# Patient Record
Sex: Male | Born: 1955 | Race: White | Hispanic: No | Marital: Married | Smoking: Never smoker
Health system: Southern US, Community
[De-identification: ages and names within clinical notes are randomized; demographics above are authoritative.]

---

## 2012-01-08 ENCOUNTER — Ambulatory Visit: Payer: Self-pay

## 2012-01-08 LAB — URINALYSIS, COMPLETE
Bilirubin,UR: NEGATIVE
Blood: NEGATIVE
Ketone: NEGATIVE
Leukocyte Esterase: NEGATIVE
Ph: 7.5 (ref 4.5–8.0)
RBC,UR: NONE SEEN /HPF (ref 0–5)
Specific Gravity: 1.005 (ref 1.003–1.030)

## 2015-12-02 ENCOUNTER — Emergency Department
Admission: EM | Admit: 2015-12-02 | Discharge: 2015-12-03 | Disposition: A | Discharge status: Other Acute Inpatient Hospital | Payer: No Typology Code available for payment source | Attending: Emergency Medicine | Admitting: Emergency Medicine

## 2015-12-02 ENCOUNTER — Emergency Department: Payer: No Typology Code available for payment source

## 2015-12-02 ENCOUNTER — Ambulatory Visit (INDEPENDENT_AMBULATORY_CARE_PROVIDER_SITE_OTHER): Payer: No Typology Code available for payment source

## 2015-12-02 ENCOUNTER — Encounter: Payer: Self-pay | Admitting: Emergency Medicine

## 2015-12-02 ENCOUNTER — Ambulatory Visit
Admission: EM | Admit: 2015-12-02 | Discharge: 2015-12-02 | Disposition: A | Discharge status: Other Acute Inpatient Hospital | Payer: No Typology Code available for payment source | Source: Home / Self Care | Attending: Family Medicine | Admitting: Family Medicine

## 2015-12-02 DIAGNOSIS — S32009A Unspecified fracture of unspecified lumbar vertebra, initial encounter for closed fracture: Secondary | ICD-10-CM

## 2015-12-02 DIAGNOSIS — G8929 Other chronic pain: Secondary | ICD-10-CM | POA: Diagnosis not present

## 2015-12-02 DIAGNOSIS — S32058A Other fracture of fifth lumbar vertebra, initial encounter for closed fracture: Secondary | ICD-10-CM | POA: Insufficient documentation

## 2015-12-02 DIAGNOSIS — T148XXA Other injury of unspecified body region, initial encounter: Secondary | ICD-10-CM

## 2015-12-02 DIAGNOSIS — Y998 Other external cause status: Secondary | ICD-10-CM | POA: Insufficient documentation

## 2015-12-02 DIAGNOSIS — S20219A Contusion of unspecified front wall of thorax, initial encounter: Secondary | ICD-10-CM | POA: Diagnosis not present

## 2015-12-02 DIAGNOSIS — S2220XA Unspecified fracture of sternum, initial encounter for closed fracture: Secondary | ICD-10-CM

## 2015-12-02 DIAGNOSIS — S32000A Wedge compression fracture of unspecified lumbar vertebra, initial encounter for closed fracture: Secondary | ICD-10-CM

## 2015-12-02 DIAGNOSIS — Y92410 Unspecified street and highway as the place of occurrence of the external cause: Secondary | ICD-10-CM | POA: Diagnosis not present

## 2015-12-02 DIAGNOSIS — S3992XA Unspecified injury of lower back, initial encounter: Secondary | ICD-10-CM | POA: Diagnosis present

## 2015-12-02 DIAGNOSIS — S32050A Wedge compression fracture of fifth lumbar vertebra, initial encounter for closed fracture: Secondary | ICD-10-CM | POA: Diagnosis not present

## 2015-12-02 DIAGNOSIS — Y9389 Activity, other specified: Secondary | ICD-10-CM | POA: Diagnosis not present

## 2015-12-02 LAB — CBC
HEMATOCRIT: 43.4 % (ref 40.0–52.0)
Hemoglobin: 14.6 g/dL (ref 13.0–18.0)
MCH: 30.7 pg (ref 26.0–34.0)
MCHC: 33.7 g/dL (ref 32.0–36.0)
MCV: 91 fL (ref 80.0–100.0)
PLATELETS: 222 10*3/uL (ref 150–440)
RBC: 4.77 MIL/uL (ref 4.40–5.90)
RDW: 12.9 % (ref 11.5–14.5)
WBC: 11.7 10*3/uL — AB (ref 3.8–10.6)

## 2015-12-02 LAB — COMPREHENSIVE METABOLIC PANEL
ALBUMIN: 4.5 g/dL (ref 3.5–5.0)
ALT: 26 U/L (ref 17–63)
AST: 32 U/L (ref 15–41)
Alkaline Phosphatase: 61 U/L (ref 38–126)
Anion gap: 7 (ref 5–15)
BUN: 20 mg/dL (ref 6–20)
CHLORIDE: 102 mmol/L (ref 101–111)
CO2: 27 mmol/L (ref 22–32)
CREATININE: 1.07 mg/dL (ref 0.61–1.24)
Calcium: 9.1 mg/dL (ref 8.9–10.3)
GFR calc Af Amer: >60 mL/min (ref >60)
GLUCOSE: 127 mg/dL — AB (ref 65–99)
Potassium: 4.3 mmol/L (ref 3.5–5.1)
Sodium: 136 mmol/L (ref 135–145)
Total Bilirubin: 0.6 mg/dL (ref 0.3–1.2)
Total Protein: 7 g/dL (ref 6.5–8.1)

## 2015-12-02 LAB — TROPONIN I

## 2015-12-02 MED ORDER — ACETAMINOPHEN 500 MG PO TABS
1000.0000 mg | ORAL_TABLET | Freq: Once | ORAL | Status: AC
  Administered 2015-12-02: 1000 mg via ORAL

## 2015-12-02 MED ORDER — ONDANSETRON 8 MG PO TBDP
8.0000 mg | ORAL_TABLET | Freq: Once | ORAL | Status: AC
  Administered 2015-12-02: 8 mg via ORAL

## 2015-12-02 MED ORDER — OXYCODONE HCL 5 MG PO TABS
5.0000 mg | ORAL_TABLET | ORAL | Status: AC
  Administered 2015-12-02: 5 mg via ORAL
  Filled 2015-12-02: qty 1

## 2015-12-02 MED ORDER — SODIUM CHLORIDE 0.9 % IV BOLUS (SEPSIS)
1000.0000 mL | Freq: Once | INTRAVENOUS | Status: AC
  Administered 2015-12-02: 1000 mL via INTRAVENOUS
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
## 2015-12-02 NOTE — ED Notes (Signed)
Patient transported to CT 
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
## 2015-12-03 ENCOUNTER — Encounter: Payer: Self-pay | Admitting: Radiology

## 2015-12-03 MED ORDER — IOHEXOL 350 MG/ML SOLN: 100.0000 mL | Freq: Once | INTRAVENOUS | Status: DC | PRN

## 2015-12-03 MED ORDER — OXYCODONE-ACETAMINOPHEN 5-325 MG PO TABS: 1.0000 | ORAL_TABLET | Freq: Four times a day (QID) | ORAL | Status: AC | PRN

## 2015-12-03 NOTE — ED Notes (Signed)
[facility] arrived.

## 2015-12-03 NOTE — ED Provider Notes (Signed)
-----------------------------------------   1:20 AM on 12/03/2015 -----------------------------------------  CT Chest/Abdomen/Pelvis interpreted per Dr. Clovis RileyMitchell: 1. Mildly displaced sternal fracture. Small retrosternal hematoma. 2. Acute fracture at the antero superior endplate of the L5 vertebral body. 3. Mild atelectatic appearing posterior lung base opacity bilaterally 4. No evidence of a parenchymal organ injury or hollow viscus injury. No peritoneal blood or free air.  Updated patient and spouse of CT results. [facility] transfer center contacted for transfer. Accepted by Dr. Matilde [facility]makerSerrano [facility]).  Irean HongJade J Sung, MD 12/03/15 [phone]
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
